# Patient Record
Sex: Male | Born: 1970 | Race: White | Hispanic: No | Marital: Married | Smoking: Never smoker
Health system: Southern US, Community
[De-identification: ages and names within clinical notes are randomized; demographics above are authoritative.]

## PROBLEM LIST (undated history)

## (undated) DIAGNOSIS — E291 Testicular hypofunction: Secondary | ICD-10-CM

## (undated) DIAGNOSIS — E781 Pure hyperglyceridemia: Secondary | ICD-10-CM

## (undated) DIAGNOSIS — M545 Low back pain, unspecified: Secondary | ICD-10-CM

## (undated) DIAGNOSIS — R7989 Other specified abnormal findings of blood chemistry: Secondary | ICD-10-CM

## (undated) DIAGNOSIS — M25519 Pain in unspecified shoulder: Secondary | ICD-10-CM

## (undated) DIAGNOSIS — E7800 Pure hypercholesterolemia, unspecified: Secondary | ICD-10-CM

## (undated) DIAGNOSIS — L309 Dermatitis, unspecified: Secondary | ICD-10-CM

## (undated) DIAGNOSIS — E119 Type 2 diabetes mellitus without complications: Secondary | ICD-10-CM

## (undated) DIAGNOSIS — J302 Other seasonal allergic rhinitis: Secondary | ICD-10-CM

## (undated) DIAGNOSIS — E113293 Type 2 diabetes mellitus with mild nonproliferative diabetic retinopathy without macular edema, bilateral: Secondary | ICD-10-CM

## (undated) DIAGNOSIS — F324 Major depressive disorder, single episode, in partial remission: Secondary | ICD-10-CM

## (undated) DIAGNOSIS — E669 Obesity, unspecified: Secondary | ICD-10-CM

## (undated) DIAGNOSIS — I10 Essential (primary) hypertension: Secondary | ICD-10-CM

## (undated) DIAGNOSIS — K645 Perianal venous thrombosis: Secondary | ICD-10-CM

## (undated) DIAGNOSIS — M1990 Unspecified osteoarthritis, unspecified site: Secondary | ICD-10-CM

## (undated) DIAGNOSIS — N182 Chronic kidney disease, stage 2 (mild): Secondary | ICD-10-CM

## (undated) DIAGNOSIS — R7401 Elevation of levels of liver transaminase levels: Secondary | ICD-10-CM

## (undated) DIAGNOSIS — M549 Dorsalgia, unspecified: Secondary | ICD-10-CM

## (undated) DIAGNOSIS — G8929 Other chronic pain: Secondary | ICD-10-CM

## (undated) DIAGNOSIS — G5761 Lesion of plantar nerve, right lower limb: Secondary | ICD-10-CM

## (undated) DIAGNOSIS — F419 Anxiety disorder, unspecified: Secondary | ICD-10-CM

## (undated) DIAGNOSIS — K219 Gastro-esophageal reflux disease without esophagitis: Secondary | ICD-10-CM

## (undated) HISTORY — DX: Obesity, unspecified: E66.9

## (undated) HISTORY — DX: Dorsalgia, unspecified: M54.9

## (undated) HISTORY — DX: Essential (primary) hypertension: I10

## (undated) HISTORY — DX: Type 2 diabetes mellitus with mild nonproliferative diabetic retinopathy without macular edema, bilateral: E11.3293

## (undated) HISTORY — DX: Other seasonal allergic rhinitis: J30.2

## (undated) HISTORY — PX: SHOULDER SURGERY: SHX246

## (undated) HISTORY — DX: Low back pain, unspecified: M54.50

## (undated) HISTORY — DX: Dermatitis, unspecified: L30.9

## (undated) HISTORY — DX: Lesion of plantar nerve, right lower limb: G57.61

## (undated) HISTORY — PX: TARSAL TUNNEL RELEASE: SHX5042

## (undated) HISTORY — DX: Pure hyperglyceridemia: E78.1

## (undated) HISTORY — DX: Testicular hypofunction: E29.1

## (undated) HISTORY — DX: Other chronic pain: G89.29

## (undated) HISTORY — DX: Chronic kidney disease, stage 2 (mild): N18.2

## (undated) HISTORY — DX: Other specified abnormal findings of blood chemistry: R79.89

## (undated) HISTORY — DX: Unspecified osteoarthritis, unspecified site: M19.90

## (undated) HISTORY — DX: Pure hypercholesterolemia, unspecified: E78.00

## (undated) HISTORY — DX: Pain in unspecified shoulder: M25.519

## (undated) HISTORY — PX: OTHER SURGICAL HISTORY: SHX169

## (undated) HISTORY — DX: Anxiety disorder, unspecified: F41.9

## (undated) HISTORY — DX: Gastro-esophageal reflux disease without esophagitis: K21.9

## (undated) HISTORY — DX: Elevation of levels of liver transaminase levels: R74.01

## (undated) HISTORY — DX: Major depressive disorder, single episode, in partial remission: F32.4

## (undated) HISTORY — DX: Perianal venous thrombosis: K64.5

## (undated) HISTORY — DX: Type 2 diabetes mellitus without complications: E11.9

---
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
## 2000-02-28 ENCOUNTER — Encounter: Payer: Self-pay | Admitting: Specialist

## 2000-02-28 ENCOUNTER — Ambulatory Visit (HOSPITAL_COMMUNITY): Admission: RE | Admit: 2000-02-28 | Discharge: 2000-02-28 | Payer: Self-pay | Admitting: Specialist

## 2000-04-07 ENCOUNTER — Encounter: Payer: Self-pay | Admitting: Family Medicine

## 2000-04-07 ENCOUNTER — Ambulatory Visit (HOSPITAL_COMMUNITY): Admission: EM | Admit: 2000-04-07 | Discharge: 2000-04-07 | Payer: Self-pay | Admitting: *Deleted

## 2003-12-12 ENCOUNTER — Ambulatory Visit (HOSPITAL_COMMUNITY): Admission: RE | Admit: 2003-12-12 | Discharge: 2003-12-12 | Payer: Self-pay | Admitting: Orthopedic Surgery

## 2004-01-17 ENCOUNTER — Ambulatory Visit (HOSPITAL_BASED_OUTPATIENT_CLINIC_OR_DEPARTMENT_OTHER): Admission: RE | Admit: 2004-01-17 | Discharge: 2004-01-17 | Payer: Self-pay | Admitting: Orthopedic Surgery

## 2006-08-07 ENCOUNTER — Encounter: Admission: RE | Admit: 2006-08-07 | Discharge: 2006-08-07 | Payer: Self-pay | Admitting: Specialist

## 2006-08-14 ENCOUNTER — Emergency Department (HOSPITAL_COMMUNITY): Admission: EM | Admit: 2006-08-14 | Discharge: 2006-08-14 | Payer: Self-pay | Admitting: Emergency Medicine

## 2009-04-25 ENCOUNTER — Encounter: Admission: RE | Admit: 2009-04-25 | Discharge: 2009-04-25 | Payer: Self-pay | Admitting: Family Medicine

## 2010-09-13 ENCOUNTER — Encounter: Admission: RE | Admit: 2010-09-13 | Discharge: 2010-09-13 | Payer: Self-pay | Admitting: Family Medicine

## 2011-04-05 NOTE — Op Note (Signed)
NAME:  Jesse Miller, Jesse Miller                        ACCOUNT NO.:  [id number]   MEDICAL RECORD NO.:  [id number]                   PATIENT TYPE:  AMB   LOCATION:  DSC                                  FACILITY:  [facility]   PHYSICIAN:  Leonides Grills, M.D.                  DATE OF BIRTH:  09/15/1971   DATE OF PROCEDURE:  01/17/2004  DATE OF DISCHARGE:                                 OPERATIVE REPORT   PREOPERATIVE DIAGNOSIS:  1. Right ankle impingement.  2. Right tarsal tunnel.  3. Right flexor hallucis longus tenosynovitis.  4. 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   POSTOPERATIVE DIAGNOSIS:  1. Right ankle impingement.  2. Right tarsal tunnel.  3. Right flexor hallucis longus tenosynovitis.  4. 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   OPERATION PERFORMED:  1. Right ankle arthroscopy with extensive debridement.  2. Right tarsal tunnel release.  3. Right excision os trigonum with posterior ankle tenosynovectomy.  4. Tenolysis at right flexor hallucis longus tendon.   SURGEON:  Leonides Grills, M.D.   ASSISTANT:  Lianne Cure, P.A.   ANESTHESIA:  General endotracheal tube.   ESTIMATED BLOOD LOSS:  Minimal.   TOURNIQUET TIME:  Approximately an hour an a half.   COMPLICATIONS:  None.   DISPOSITION:  Stable to PR.   INDICATIONS FOR PROCEDURE:  The patient is a 40 year old male with  persistent posteromedial, posterior and anterior ankle pain that was  interfering with his life to the point that he can not do what he wants to  do.  The patient  has consented for the above procedure.  All risks which  include infection, neurovascular injury, persistent pain, worsening pain,  stiffness, arthritis were all explained, questions were encouraged and  answered.   DESCRIPTION OF PROCEDURE:  The patient was brought to the operating room and  placed in supine position after adequate general endotracheal tube  anesthesia was administered as well as Ancef IV piggyback.   He was also  given a popliteal block preoperatively as well.  We then placed him in a leg  holder.  All bony prominences were well padded and we externally rotated the  leg to allow for adequate posteromedial dissection. We then prepped and  draped the right lower extremity in a sterile manner.  We gravity  exsanguinated the right lower extremity and the  tourniquet was elevated to  290 mmHg.  A curvilinear incision midline over the posteromedial aspect of  the right ankle was then made.  Dissection was carried down through skin and  hemostasis was obtained.  The tarsal tunnel was then identified and this was  then released completely dissecting off the neurovascular structures  carefully throughout this procedure.  Neurovascular structures were then  retracted posteriorly and the FHL tendon was then identified.  This was then  released completely down to the sinus tarsi.  Once this was done, we then  retracted this posteriorly and identified the posterior aspect of the ankle  joint. Capsulotomy was then made and the os trigonum was then removed as  well as the local synovitis in the area.  Once this was extensively debrided  with a rongeur, we then closed subcu with 3-0 Vicryl.  Skin was closed with  4-0 nylon.  We then internally rotated the leg using a bolster.  All bony  prominences were well padded and then marked out the anatomical landmarks on  the anterior aspect of the ankle.  Anteromedial portal was then created with  a nick and spread technique.  Once this was done, the blunt tip trocar  followed by a cannula was then placed followed by the scope.  Under direct  visualization the anterolateral portal was then created with spinal needle  followed by a nick and spread technique.  Once this was done, there was a  large amount of synovitis in the ankle. There was actually a band across the  entire aspect of the ankle and the accessory tib-fib ligament was also  impeding the  anterolateral aspect of the ankle. This was then debrided with  a bevel as well as shaver.  The lateral gutter also had a tremendous amount  of synovitis which was debrided with a shaver as well.  Anteromedially,  there was a large amount of synovitis and this was also debrided as well  with a shaver and bevel.  Pictures were obtained throughout this procedure.  Tourniquet was deflated.  There was a palpable posterior tibial artery.  No  pulsatile bleeding.  The wounds were closed with 4-0 nylon.  Sterile  dressing was applied.  Modified Jones dressing was applied.  The patient was  stable to the PR.                                               Leonides Grills, M.D.    PB/MEDQ  D:  01/17/2004  T:  01/18/2004  Job:  [id number]

## 2011-06-05 ENCOUNTER — Other Ambulatory Visit: Payer: Self-pay | Admitting: Family Medicine

## 2011-06-05 DIAGNOSIS — Z139 Encounter for screening, unspecified: Secondary | ICD-10-CM

## 2011-06-05 DIAGNOSIS — R5383 Other fatigue: Secondary | ICD-10-CM

## 2011-06-05 DIAGNOSIS — R42 Dizziness and giddiness: Secondary | ICD-10-CM

## 2011-06-06 ENCOUNTER — Other Ambulatory Visit: Payer: Self-pay

## 2011-06-07 ENCOUNTER — Other Ambulatory Visit: Payer: Self-pay

## 2013-01-04 ENCOUNTER — Other Ambulatory Visit: Payer: Self-pay | Admitting: Family Medicine

## 2013-01-04 ENCOUNTER — Ambulatory Visit
Admission: RE | Admit: 2013-01-04 | Discharge: 2013-01-04 | Disposition: A | Discharge status: Home / Self Care | Payer: BC Managed Care – PPO | Source: Ambulatory Visit | Attending: Family Medicine | Admitting: Family Medicine

## 2013-01-04 DIAGNOSIS — I83891 Varicose veins of right lower extremities with other complications: Secondary | ICD-10-CM

## 2013-01-04 DIAGNOSIS — M79604 Pain in right leg: Secondary | ICD-10-CM

## 2015-02-27 ENCOUNTER — Other Ambulatory Visit: Payer: Self-pay | Admitting: Family Medicine

## 2015-02-27 DIAGNOSIS — M5416 Radiculopathy, lumbar region: Secondary | ICD-10-CM

## 2015-02-27 DIAGNOSIS — M545 Low back pain: Secondary | ICD-10-CM

## 2015-02-28 ENCOUNTER — Ambulatory Visit
Admission: RE | Admit: 2015-02-28 | Discharge: 2015-02-28 | Disposition: A | Discharge status: Home / Self Care | Payer: PRIVATE HEALTH INSURANCE | Source: Ambulatory Visit | Attending: Family Medicine | Admitting: Family Medicine

## 2015-02-28 DIAGNOSIS — M545 Low back pain: Secondary | ICD-10-CM

## 2015-02-28 DIAGNOSIS — M5416 Radiculopathy, lumbar region: Secondary | ICD-10-CM

## 2015-03-15 ENCOUNTER — Other Ambulatory Visit: Payer: Self-pay | Admitting: Neurological Surgery

## 2015-03-15 DIAGNOSIS — M545 Low back pain, unspecified: Secondary | ICD-10-CM

## 2015-03-15 DIAGNOSIS — G8929 Other chronic pain: Secondary | ICD-10-CM

## 2015-03-16 ENCOUNTER — Ambulatory Visit
Admission: RE | Admit: 2015-03-16 | Discharge: 2015-03-16 | Disposition: A | Discharge status: Home / Self Care | Payer: PRIVATE HEALTH INSURANCE | Source: Ambulatory Visit | Attending: Neurological Surgery | Admitting: Neurological Surgery

## 2015-03-16 DIAGNOSIS — G8929 Other chronic pain: Secondary | ICD-10-CM

## 2015-03-16 DIAGNOSIS — M545 Low back pain: Principal | ICD-10-CM

## 2015-03-16 MED ORDER — METHYLPREDNISOLONE ACETATE 40 MG/ML INJ SUSP (RADIOLOG
120.0000 mg | Freq: Once | INTRAMUSCULAR | Status: AC
  Administered 2015-03-16: 120 mg via EPIDURAL

## 2015-03-16 MED ORDER — IOHEXOL 180 MG/ML  SOLN
1.0000 mL | Freq: Once | INTRAMUSCULAR | Status: AC | PRN
  Administered 2015-03-16: 1 mL via EPIDURAL

## 2015-03-16 NOTE — Discharge Instructions (Signed)

## 2015-03-17 ENCOUNTER — Other Ambulatory Visit: Payer: PRIVATE HEALTH INSURANCE

## 2015-10-27 ENCOUNTER — Other Ambulatory Visit: Payer: Self-pay | Admitting: Family Medicine

## 2015-10-27 DIAGNOSIS — M25512 Pain in left shoulder: Secondary | ICD-10-CM

## 2015-10-29 ENCOUNTER — Ambulatory Visit
Admission: RE | Admit: 2015-10-29 | Discharge: 2015-10-29 | Disposition: A | Discharge status: Home / Self Care | Payer: PRIVATE HEALTH INSURANCE | Source: Ambulatory Visit | Attending: Family Medicine | Admitting: Family Medicine

## 2015-10-29 DIAGNOSIS — M25512 Pain in left shoulder: Secondary | ICD-10-CM

## 2016-09-24 ENCOUNTER — Other Ambulatory Visit: Payer: Self-pay | Admitting: Family Medicine

## 2016-09-24 DIAGNOSIS — M545 Low back pain, unspecified: Secondary | ICD-10-CM

## 2016-10-02 ENCOUNTER — Ambulatory Visit
Admission: RE | Admit: 2016-10-02 | Discharge: 2016-10-02 | Disposition: A | Discharge status: Home / Self Care | Payer: 59 | Source: Ambulatory Visit | Attending: Family Medicine | Admitting: Family Medicine

## 2016-10-02 DIAGNOSIS — M545 Low back pain, unspecified: Secondary | ICD-10-CM

## 2016-10-02 MED ORDER — METHYLPREDNISOLONE ACETATE 40 MG/ML INJ SUSP (RADIOLOG
120.0000 mg | Freq: Once | INTRAMUSCULAR | Status: AC
  Administered 2016-10-02: 120 mg via EPIDURAL

## 2016-10-02 MED ORDER — IOPAMIDOL (ISOVUE-M 200) INJECTION 41%
1.0000 mL | Freq: Once | INTRAMUSCULAR | Status: AC
  Administered 2016-10-02: 1 mL via EPIDURAL

## 2016-10-02 NOTE — Discharge Instructions (Signed)

## 2017-06-10 ENCOUNTER — Ambulatory Visit
Admission: RE | Admit: 2017-06-10 | Discharge: 2017-06-10 | Disposition: A | Discharge status: Home / Self Care | Payer: 59 | Source: Ambulatory Visit | Attending: Family Medicine | Admitting: Family Medicine

## 2017-06-10 ENCOUNTER — Other Ambulatory Visit: Payer: Self-pay | Admitting: Family Medicine

## 2017-06-10 DIAGNOSIS — M545 Low back pain: Principal | ICD-10-CM

## 2017-06-10 DIAGNOSIS — G8929 Other chronic pain: Secondary | ICD-10-CM

## 2017-06-10 MED ORDER — IOPAMIDOL (ISOVUE-M 200) INJECTION 41%
1.0000 mL | Freq: Once | INTRAMUSCULAR | Status: AC
  Administered 2017-06-10: 1 mL via EPIDURAL

## 2017-06-10 MED ORDER — METHYLPREDNISOLONE ACETATE 40 MG/ML INJ SUSP (RADIOLOG
120.0000 mg | Freq: Once | INTRAMUSCULAR | Status: AC
  Administered 2017-06-10: 120 mg via EPIDURAL

## 2017-06-10 NOTE — Discharge Instructions (Signed)

## 2017-07-02 ENCOUNTER — Other Ambulatory Visit: Payer: Self-pay | Admitting: Family Medicine

## 2017-07-02 DIAGNOSIS — M545 Low back pain: Principal | ICD-10-CM

## 2017-07-02 DIAGNOSIS — G8929 Other chronic pain: Secondary | ICD-10-CM

## 2017-07-09 ENCOUNTER — Ambulatory Visit
Admission: RE | Admit: 2017-07-09 | Discharge: 2017-07-09 | Disposition: A | Discharge status: Home / Self Care | Payer: 59 | Source: Ambulatory Visit | Attending: Family Medicine | Admitting: Family Medicine

## 2017-07-09 DIAGNOSIS — G8929 Other chronic pain: Secondary | ICD-10-CM

## 2017-07-09 DIAGNOSIS — M545 Low back pain: Principal | ICD-10-CM

## 2017-07-09 MED ORDER — IOPAMIDOL (ISOVUE-M 200) INJECTION 41%
1.0000 mL | Freq: Once | INTRAMUSCULAR | Status: AC
  Administered 2017-07-09: 1 mL via EPIDURAL

## 2017-07-09 MED ORDER — METHYLPREDNISOLONE ACETATE 40 MG/ML INJ SUSP (RADIOLOG
120.0000 mg | Freq: Once | INTRAMUSCULAR | Status: AC
  Administered 2017-07-09: 120 mg via EPIDURAL

## 2020-02-21 ENCOUNTER — Other Ambulatory Visit: Payer: Self-pay | Admitting: Family Medicine

## 2020-02-21 DIAGNOSIS — G8929 Other chronic pain: Secondary | ICD-10-CM

## 2020-02-25 ENCOUNTER — Ambulatory Visit
Admission: RE | Admit: 2020-02-25 | Discharge: 2020-02-25 | Disposition: A | Discharge status: Home / Self Care | Payer: 59 | Source: Ambulatory Visit | Attending: Family Medicine | Admitting: Family Medicine

## 2020-02-25 ENCOUNTER — Other Ambulatory Visit: Payer: Self-pay

## 2020-02-25 DIAGNOSIS — G8929 Other chronic pain: Secondary | ICD-10-CM

## 2020-02-25 MED ORDER — IOPAMIDOL (ISOVUE-M 200) INJECTION 41%
1.0000 mL | Freq: Once | INTRAMUSCULAR | Status: AC
  Administered 2020-02-25: 1 mL via EPIDURAL

## 2020-02-25 MED ORDER — METHYLPREDNISOLONE ACETATE 40 MG/ML INJ SUSP (RADIOLOG
120.0000 mg | Freq: Once | INTRAMUSCULAR | Status: AC
  Administered 2020-02-25: 120 mg via EPIDURAL

## 2020-02-25 NOTE — Discharge Instructions (Signed)

## 2020-04-12 ENCOUNTER — Ambulatory Visit: Payer: 59 | Admitting: Cardiology

## 2020-04-21 ENCOUNTER — Ambulatory Visit: Payer: 59 | Admitting: Interventional Cardiology

## 2020-04-26 ENCOUNTER — Other Ambulatory Visit: Payer: Self-pay | Admitting: Family Medicine

## 2020-04-26 DIAGNOSIS — G8929 Other chronic pain: Secondary | ICD-10-CM

## 2020-04-26 DIAGNOSIS — M545 Low back pain, unspecified: Secondary | ICD-10-CM

## 2020-05-01 ENCOUNTER — Ambulatory Visit
Admission: RE | Admit: 2020-05-01 | Discharge: 2020-05-01 | Disposition: A | Discharge status: Home / Self Care | Payer: 59 | Source: Ambulatory Visit | Attending: Family Medicine | Admitting: Family Medicine

## 2020-05-01 ENCOUNTER — Other Ambulatory Visit: Payer: Self-pay

## 2020-05-01 DIAGNOSIS — G8929 Other chronic pain: Secondary | ICD-10-CM

## 2020-05-01 DIAGNOSIS — M545 Low back pain, unspecified: Secondary | ICD-10-CM

## 2020-05-01 MED ORDER — IOPAMIDOL (ISOVUE-M 200) INJECTION 41%
1.0000 mL | Freq: Once | INTRAMUSCULAR | Status: AC
  Administered 2020-05-01: 1 mL via EPIDURAL

## 2020-05-01 MED ORDER — METHYLPREDNISOLONE ACETATE 40 MG/ML INJ SUSP (RADIOLOG
120.0000 mg | Freq: Once | INTRAMUSCULAR | Status: AC
  Administered 2020-05-01: 120 mg via EPIDURAL

## 2020-05-01 NOTE — Discharge Instructions (Signed)

## 2020-06-13 ENCOUNTER — Ambulatory Visit: Payer: 59 | Admitting: Interventional Cardiology

## 2020-06-15 ENCOUNTER — Other Ambulatory Visit: Payer: Self-pay

## 2020-06-15 ENCOUNTER — Other Ambulatory Visit: Payer: Self-pay | Admitting: Physician Assistant

## 2020-06-15 ENCOUNTER — Ambulatory Visit
Admission: RE | Admit: 2020-06-15 | Discharge: 2020-06-15 | Disposition: A | Discharge status: Home / Self Care | Payer: 59 | Source: Ambulatory Visit | Attending: Physician Assistant | Admitting: Physician Assistant

## 2020-06-15 DIAGNOSIS — R059 Cough, unspecified: Secondary | ICD-10-CM

## 2020-08-24 ENCOUNTER — Ambulatory Visit: Payer: 59 | Admitting: Interventional Cardiology

## 2020-08-24 ENCOUNTER — Encounter: Payer: Self-pay | Admitting: General Practice

## 2020-08-28 DIAGNOSIS — U071 COVID-19: Secondary | ICD-10-CM | POA: Diagnosis not present

## 2020-08-29 ENCOUNTER — Telehealth: Payer: Self-pay | Admitting: Nurse Practitioner

## 2020-08-29 ENCOUNTER — Other Ambulatory Visit (HOSPITAL_COMMUNITY): Payer: Self-pay

## 2020-08-29 ENCOUNTER — Telehealth: Payer: Self-pay | Admitting: Unknown Physician Specialty

## 2020-08-29 NOTE — Telephone Encounter (Signed)
Called to discuss with Dian Queen about Covid symptoms and the use of the monoclonal antibody infusion for those with mild to moderate Covid symptoms and at a high risk of hospitalization.     Pt is qualified for this infusion at the [facility] infusion center due to co-morbid conditions (as indicated below) and/or a member of an at-risk group in accordance with the FDA Emergency Use Authorization.   Unable to reach. Voicemail full.   Willette Alma, AGPCNP-BC

## 2020-08-29 NOTE — Telephone Encounter (Signed)
Called to Discuss with patient about Covid symptoms and the use of the monoclonal antibody infusion for those with mild to moderate Covid symptoms and at a high risk of hospitalization.     Pt appears to qualify for this infusion due to co-morbid conditions and/or a member of an at-risk group in accordance with the FDA Emergency Use Authorization.    Unable to reach pt    

## 2020-08-30 ENCOUNTER — Other Ambulatory Visit: Payer: Self-pay | Admitting: Family

## 2020-08-30 ENCOUNTER — Telehealth: Payer: Self-pay | Admitting: Family

## 2020-08-30 DIAGNOSIS — E119 Type 2 diabetes mellitus without complications: Secondary | ICD-10-CM

## 2020-08-30 DIAGNOSIS — I10 Essential (primary) hypertension: Secondary | ICD-10-CM

## 2020-08-30 DIAGNOSIS — U071 COVID-19: Secondary | ICD-10-CM

## 2020-08-30 DIAGNOSIS — E669 Obesity, unspecified: Secondary | ICD-10-CM

## 2020-08-30 NOTE — Telephone Encounter (Signed)
Called to Discuss with patient about Covid symptoms and the use of the monoclonal antibody infusion for those with mild to moderate Covid symptoms and at a high risk of hospitalization.     Pt appears to qualify for this infusion due to co-morbid conditions and/or a member of an at-risk group in accordance with the FDA Emergency Use Authorization.    Mr. Moten onset of symptoms was 10/9 and is currently experiencing congestion, back pain, headache, and fatigue. Positive COVID test at [facility] in [place] with positive results sent to his PCP Dr. Tiburcio Pea. Results were sent to infusion clinic. Qualifying risk factors include diabetes, BMI >25, and hypertension.   Discussed the risks, benefits, and potential financial charges related to treatment with monoclonal antibodies and he wishes to proceed with treatment.   Hello IRBY FAILS,   You have been scheduled to receive the monoclonal antibody therapy at [facility]: 08/31/20 at 12:30pm   If you have been tested outside of a [facility] - you MUST bring a copy of your positive test with you the morning of your appointment. You may take a photo of this and upload to your MyChart portal or have the testing facility fax the result to [phone]    The address for the infusion clinic site is:  --GPS address is [address] - the parking is located near [employer] building where you will see  COVID19 Infusion feather banner marking the entrance to parking.   (see photos below)            --Enter into the 2nd entrance where the "wave, flag banner" is at the road. Turn into this 2nd entrance and immediately turn left to park in 1 of the 5 parking spots.   --Please stay in your car and call the desk for assistance inside [phone].   --Average time in department is roughly 2 hours for Regeneron treatment - this includes preparation of the medication, IV start and the required 1 hour monitoring after the  infusion.    Should you develop worsening shortness of breath, chest pain or severe breathing problems please do not wait for this appointment and go to the Emergency room for evaluation and treatment. You will undergo another oxygen screen before your infusion to ensure this is the best treatment option for you. There is a chance that the best decision may be to send you to the Emergency Room for evaluation at the time of your appointment.   The day of your visit you should: Marland Kitchen Get plenty of rest the night before and drink plenty of water . Eat a light meal/snack before coming and take your medications as prescribed  . Wear warm, comfortable clothes with a shirt that can roll-up over the elbow (will need IV start).  . Wear a mask  . Consider bringing some activity to help pass the time  Many commercial insurers are waiving bills related to COVID treatment however some have ranged from $300-640. We are starting to see some insurers send bills to patients later for the administration of the medication - we are learning more information but you may receive a bill after your appointment.  Please contact your insurance agent to discuss prior to your appointment if you would like further details about billing specific to your policy.    The CPT code is [phone] for your reference.    Marcos Eke, NP 08/30/2020 3:35 PM

## 2020-08-30 NOTE — Telephone Encounter (Signed)
Returned call from Mr. Veras from this morning and received his voicemail. Generic message with hotline number was left. Of note this is the 3rd attempt to speak with him.   Marcos Eke, NP 08/30/2020 1:42 PM

## 2020-08-30 NOTE — Progress Notes (Signed)
I connected by phone with Dian Queen on 08/30/2020 at 3:36 PM to discuss the potential use of a new treatment for mild to moderate COVID-19 viral infection in non-hospitalized patients.  This patient is a 49 y.o. male that meets the FDA criteria for Emergency Use Authorization of COVID monoclonal antibody casirivimab/imdevimab or bamlanivimab/eteseviamb.  Has a (+) direct SARS-CoV-2 viral test result  Has mild or moderate COVID-19   Is NOT hospitalized due to COVID-19  Is within 10 days of symptom onset  Has at least one of the high risk factor(s) for progression to severe COVID-19 and/or hospitalization as defined in EUA.  Specific high risk criteria : BMI > 25, Diabetes and Cardiovascular disease or hypertension   I have spoken and communicated the following to the patient or parent/caregiver regarding COVID monoclonal antibody treatment:  1. FDA has authorized the emergency use for the treatment of mild to moderate COVID-19 in adults and pediatric patients with positive results of direct SARS-CoV-2 viral testing who are 22 years of age and older weighing at least 40 kg, and who are at high risk for progressing to severe COVID-19 and/or hospitalization.  2. The significant known and potential risks and benefits of COVID monoclonal antibody, and the extent to which such potential risks and benefits are unknown.  3. Information on available alternative treatments and the risks and benefits of those alternatives, including clinical trials.  4. Patients treated with COVID monoclonal antibody should continue to self-isolate and use infection control measures (e.g., wear mask, isolate, social distance, avoid sharing personal items, clean and disinfect "high touch" surfaces, and frequent handwashing) according to CDC guidelines.   5. The patient or parent/caregiver has the option to accept or refuse COVID monoclonal antibody treatment.  After reviewing this information with the patient,  the patient has agreed to receive one of the available covid 19 monoclonal antibodies and will be provided an appropriate fact sheet prior to infusion.   Jeanine Luz, FNP 08/30/2020 3:36 PM

## 2020-08-31 ENCOUNTER — Ambulatory Visit (HOSPITAL_COMMUNITY)
Admission: RE | Admit: 2020-08-31 | Discharge: 2020-08-31 | Disposition: A | Discharge status: Home / Self Care | Payer: BC Managed Care – PPO | Source: Ambulatory Visit | Attending: Pulmonary Disease | Admitting: Pulmonary Disease

## 2020-08-31 DIAGNOSIS — U071 COVID-19: Secondary | ICD-10-CM | POA: Insufficient documentation

## 2020-08-31 DIAGNOSIS — I10 Essential (primary) hypertension: Secondary | ICD-10-CM | POA: Diagnosis not present

## 2020-08-31 DIAGNOSIS — E669 Obesity, unspecified: Secondary | ICD-10-CM | POA: Insufficient documentation

## 2020-08-31 DIAGNOSIS — E119 Type 2 diabetes mellitus without complications: Secondary | ICD-10-CM | POA: Diagnosis not present

## 2020-08-31 MED ORDER — FAMOTIDINE IN NACL 20-0.9 MG/50ML-% IV SOLN: 20.0000 mg | Freq: Once | INTRAVENOUS | Status: DC | PRN

## 2020-08-31 MED ORDER — SODIUM CHLORIDE 0.9 % IV SOLN: INTRAVENOUS | Status: DC | PRN

## 2020-08-31 MED ORDER — SODIUM CHLORIDE 0.9 % IV SOLN: Freq: Once | INTRAVENOUS | Status: AC

## 2020-08-31 MED ORDER — EPINEPHRINE 0.3 MG/0.3ML IJ SOAJ: 0.3000 mg | Freq: Once | INTRAMUSCULAR | Status: DC | PRN

## 2020-08-31 MED ORDER — METHYLPREDNISOLONE SODIUM SUCC 125 MG IJ SOLR: 125.0000 mg | Freq: Once | INTRAMUSCULAR | Status: DC | PRN

## 2020-08-31 MED ORDER — DIPHENHYDRAMINE HCL 50 MG/ML IJ SOLN: 50.0000 mg | Freq: Once | INTRAMUSCULAR | Status: DC | PRN

## 2020-08-31 MED ORDER — ALBUTEROL SULFATE HFA 108 (90 BASE) MCG/ACT IN AERS: 2.0000 | INHALATION_SPRAY | Freq: Once | RESPIRATORY_TRACT | Status: DC | PRN

## 2020-08-31 NOTE — Discharge Instructions (Signed)

## 2020-08-31 NOTE — Progress Notes (Signed)
  Diagnosis: COVID-19  Physician: Dr Delford Field  Procedure: Covid Infusion Clinic Med: bamlanivimab\etesevimab infusion - Provided patient with bamlanimivab\etesevimab fact sheet for patients, parents and caregivers prior to infusion.  Complications: No immediate complications noted.  Discharge: Discharged home   Dimitri Ped 08/31/2020

## 2020-09-19 DIAGNOSIS — Z794 Long term (current) use of insulin: Secondary | ICD-10-CM | POA: Diagnosis not present

## 2020-09-19 DIAGNOSIS — E1169 Type 2 diabetes mellitus with other specified complication: Secondary | ICD-10-CM | POA: Diagnosis not present

## 2020-09-19 DIAGNOSIS — E1165 Type 2 diabetes mellitus with hyperglycemia: Secondary | ICD-10-CM | POA: Diagnosis not present

## 2020-09-19 DIAGNOSIS — E11319 Type 2 diabetes mellitus with unspecified diabetic retinopathy without macular edema: Secondary | ICD-10-CM | POA: Diagnosis not present

## 2020-09-27 DIAGNOSIS — E1169 Type 2 diabetes mellitus with other specified complication: Secondary | ICD-10-CM | POA: Diagnosis not present

## 2020-09-27 DIAGNOSIS — E785 Hyperlipidemia, unspecified: Secondary | ICD-10-CM | POA: Diagnosis not present

## 2020-09-27 DIAGNOSIS — Z794 Long term (current) use of insulin: Secondary | ICD-10-CM | POA: Diagnosis not present

## 2020-09-27 DIAGNOSIS — I10 Essential (primary) hypertension: Secondary | ICD-10-CM | POA: Diagnosis not present

## 2020-09-27 DIAGNOSIS — E1165 Type 2 diabetes mellitus with hyperglycemia: Secondary | ICD-10-CM | POA: Diagnosis not present

## 2020-11-01 DIAGNOSIS — F324 Major depressive disorder, single episode, in partial remission: Secondary | ICD-10-CM | POA: Diagnosis not present

## 2020-11-01 DIAGNOSIS — E7800 Pure hypercholesterolemia, unspecified: Secondary | ICD-10-CM | POA: Diagnosis not present

## 2020-11-01 DIAGNOSIS — E119 Type 2 diabetes mellitus without complications: Secondary | ICD-10-CM | POA: Diagnosis not present

## 2020-11-01 DIAGNOSIS — Z23 Encounter for immunization: Secondary | ICD-10-CM | POA: Diagnosis not present

## 2020-11-01 DIAGNOSIS — I10 Essential (primary) hypertension: Secondary | ICD-10-CM | POA: Diagnosis not present

## 2020-11-28 DIAGNOSIS — M65342 Trigger finger, left ring finger: Secondary | ICD-10-CM | POA: Diagnosis not present

## 2020-11-28 DIAGNOSIS — M65352 Trigger finger, left little finger: Secondary | ICD-10-CM | POA: Diagnosis not present

## 2020-11-28 DIAGNOSIS — M65351 Trigger finger, right little finger: Secondary | ICD-10-CM | POA: Diagnosis not present

## 2020-11-28 DIAGNOSIS — M65341 Trigger finger, right ring finger: Secondary | ICD-10-CM | POA: Diagnosis not present

## 2021-01-25 DIAGNOSIS — E291 Testicular hypofunction: Secondary | ICD-10-CM | POA: Diagnosis not present

## 2021-02-01 DIAGNOSIS — E291 Testicular hypofunction: Secondary | ICD-10-CM | POA: Diagnosis not present

## 2021-03-20 DIAGNOSIS — Z794 Long term (current) use of insulin: Secondary | ICD-10-CM | POA: Diagnosis not present

## 2021-03-20 DIAGNOSIS — E11319 Type 2 diabetes mellitus with unspecified diabetic retinopathy without macular edema: Secondary | ICD-10-CM | POA: Diagnosis not present

## 2021-03-20 DIAGNOSIS — E1169 Type 2 diabetes mellitus with other specified complication: Secondary | ICD-10-CM | POA: Diagnosis not present

## 2021-03-20 DIAGNOSIS — E1165 Type 2 diabetes mellitus with hyperglycemia: Secondary | ICD-10-CM | POA: Diagnosis not present

## 2021-05-17 DIAGNOSIS — E1169 Type 2 diabetes mellitus with other specified complication: Secondary | ICD-10-CM | POA: Diagnosis not present

## 2021-05-17 DIAGNOSIS — E119 Type 2 diabetes mellitus without complications: Secondary | ICD-10-CM | POA: Diagnosis not present

## 2021-05-17 DIAGNOSIS — E669 Obesity, unspecified: Secondary | ICD-10-CM | POA: Diagnosis not present

## 2021-05-17 DIAGNOSIS — I10 Essential (primary) hypertension: Secondary | ICD-10-CM | POA: Diagnosis not present

## 2021-05-17 DIAGNOSIS — E785 Hyperlipidemia, unspecified: Secondary | ICD-10-CM | POA: Diagnosis not present

## 2021-06-12 DIAGNOSIS — E119 Type 2 diabetes mellitus without complications: Secondary | ICD-10-CM | POA: Diagnosis not present

## 2021-06-21 DIAGNOSIS — I10 Essential (primary) hypertension: Secondary | ICD-10-CM | POA: Diagnosis not present

## 2021-06-21 DIAGNOSIS — E119 Type 2 diabetes mellitus without complications: Secondary | ICD-10-CM | POA: Diagnosis not present

## 2021-06-21 DIAGNOSIS — E7800 Pure hypercholesterolemia, unspecified: Secondary | ICD-10-CM | POA: Diagnosis not present

## 2021-07-18 DIAGNOSIS — E119 Type 2 diabetes mellitus without complications: Secondary | ICD-10-CM | POA: Diagnosis not present

## 2021-07-18 DIAGNOSIS — I10 Essential (primary) hypertension: Secondary | ICD-10-CM | POA: Diagnosis not present

## 2021-07-18 DIAGNOSIS — R42 Dizziness and giddiness: Secondary | ICD-10-CM | POA: Diagnosis not present

## 2021-07-18 DIAGNOSIS — E7800 Pure hypercholesterolemia, unspecified: Secondary | ICD-10-CM | POA: Diagnosis not present

## 2021-07-26 DIAGNOSIS — E291 Testicular hypofunction: Secondary | ICD-10-CM | POA: Diagnosis not present

## 2021-07-26 DIAGNOSIS — Z125 Encounter for screening for malignant neoplasm of prostate: Secondary | ICD-10-CM | POA: Diagnosis not present

## 2021-08-02 DIAGNOSIS — E291 Testicular hypofunction: Secondary | ICD-10-CM | POA: Diagnosis not present

## 2021-08-02 DIAGNOSIS — Z125 Encounter for screening for malignant neoplasm of prostate: Secondary | ICD-10-CM | POA: Diagnosis not present

## 2021-09-04 DIAGNOSIS — R079 Chest pain, unspecified: Secondary | ICD-10-CM | POA: Diagnosis not present

## 2021-09-05 DIAGNOSIS — C44619 Basal cell carcinoma of skin of left upper limb, including shoulder: Secondary | ICD-10-CM | POA: Diagnosis not present

## 2021-09-05 DIAGNOSIS — D2271 Melanocytic nevi of right lower limb, including hip: Secondary | ICD-10-CM | POA: Diagnosis not present

## 2021-09-05 DIAGNOSIS — L570 Actinic keratosis: Secondary | ICD-10-CM | POA: Diagnosis not present

## 2021-09-05 DIAGNOSIS — D225 Melanocytic nevi of trunk: Secondary | ICD-10-CM | POA: Diagnosis not present

## 2021-09-12 ENCOUNTER — Other Ambulatory Visit: Payer: Self-pay | Admitting: Family Medicine

## 2021-09-12 DIAGNOSIS — M545 Low back pain, unspecified: Secondary | ICD-10-CM

## 2021-09-12 DIAGNOSIS — G8929 Other chronic pain: Secondary | ICD-10-CM

## 2021-09-12 DIAGNOSIS — M5416 Radiculopathy, lumbar region: Secondary | ICD-10-CM

## 2021-10-17 DIAGNOSIS — J019 Acute sinusitis, unspecified: Secondary | ICD-10-CM | POA: Diagnosis not present

## 2021-10-17 DIAGNOSIS — H66001 Acute suppurative otitis media without spontaneous rupture of ear drum, right ear: Secondary | ICD-10-CM | POA: Diagnosis not present

## 2021-10-18 ENCOUNTER — Other Ambulatory Visit: Payer: Self-pay

## 2021-10-18 ENCOUNTER — Ambulatory Visit
Admission: RE | Admit: 2021-10-18 | Discharge: 2021-10-18 | Disposition: A | Discharge status: Home / Self Care | Payer: 59 | Source: Ambulatory Visit | Attending: Family Medicine | Admitting: Family Medicine

## 2021-10-18 DIAGNOSIS — M545 Low back pain, unspecified: Secondary | ICD-10-CM

## 2021-10-18 DIAGNOSIS — G8929 Other chronic pain: Secondary | ICD-10-CM

## 2021-10-18 DIAGNOSIS — M5416 Radiculopathy, lumbar region: Secondary | ICD-10-CM

## 2021-10-18 DIAGNOSIS — M5127 Other intervertebral disc displacement, lumbosacral region: Secondary | ICD-10-CM | POA: Diagnosis not present

## 2021-10-18 IMAGING — CR DG CHEST 2V
2 series · 2 of 2 positions shown · non-contrast
Comparison: Chest radiograph dated 04/25/2009

CLINICAL DATA: Cough

EXAM:
CHEST - 2 VIEW

[w chest pa]
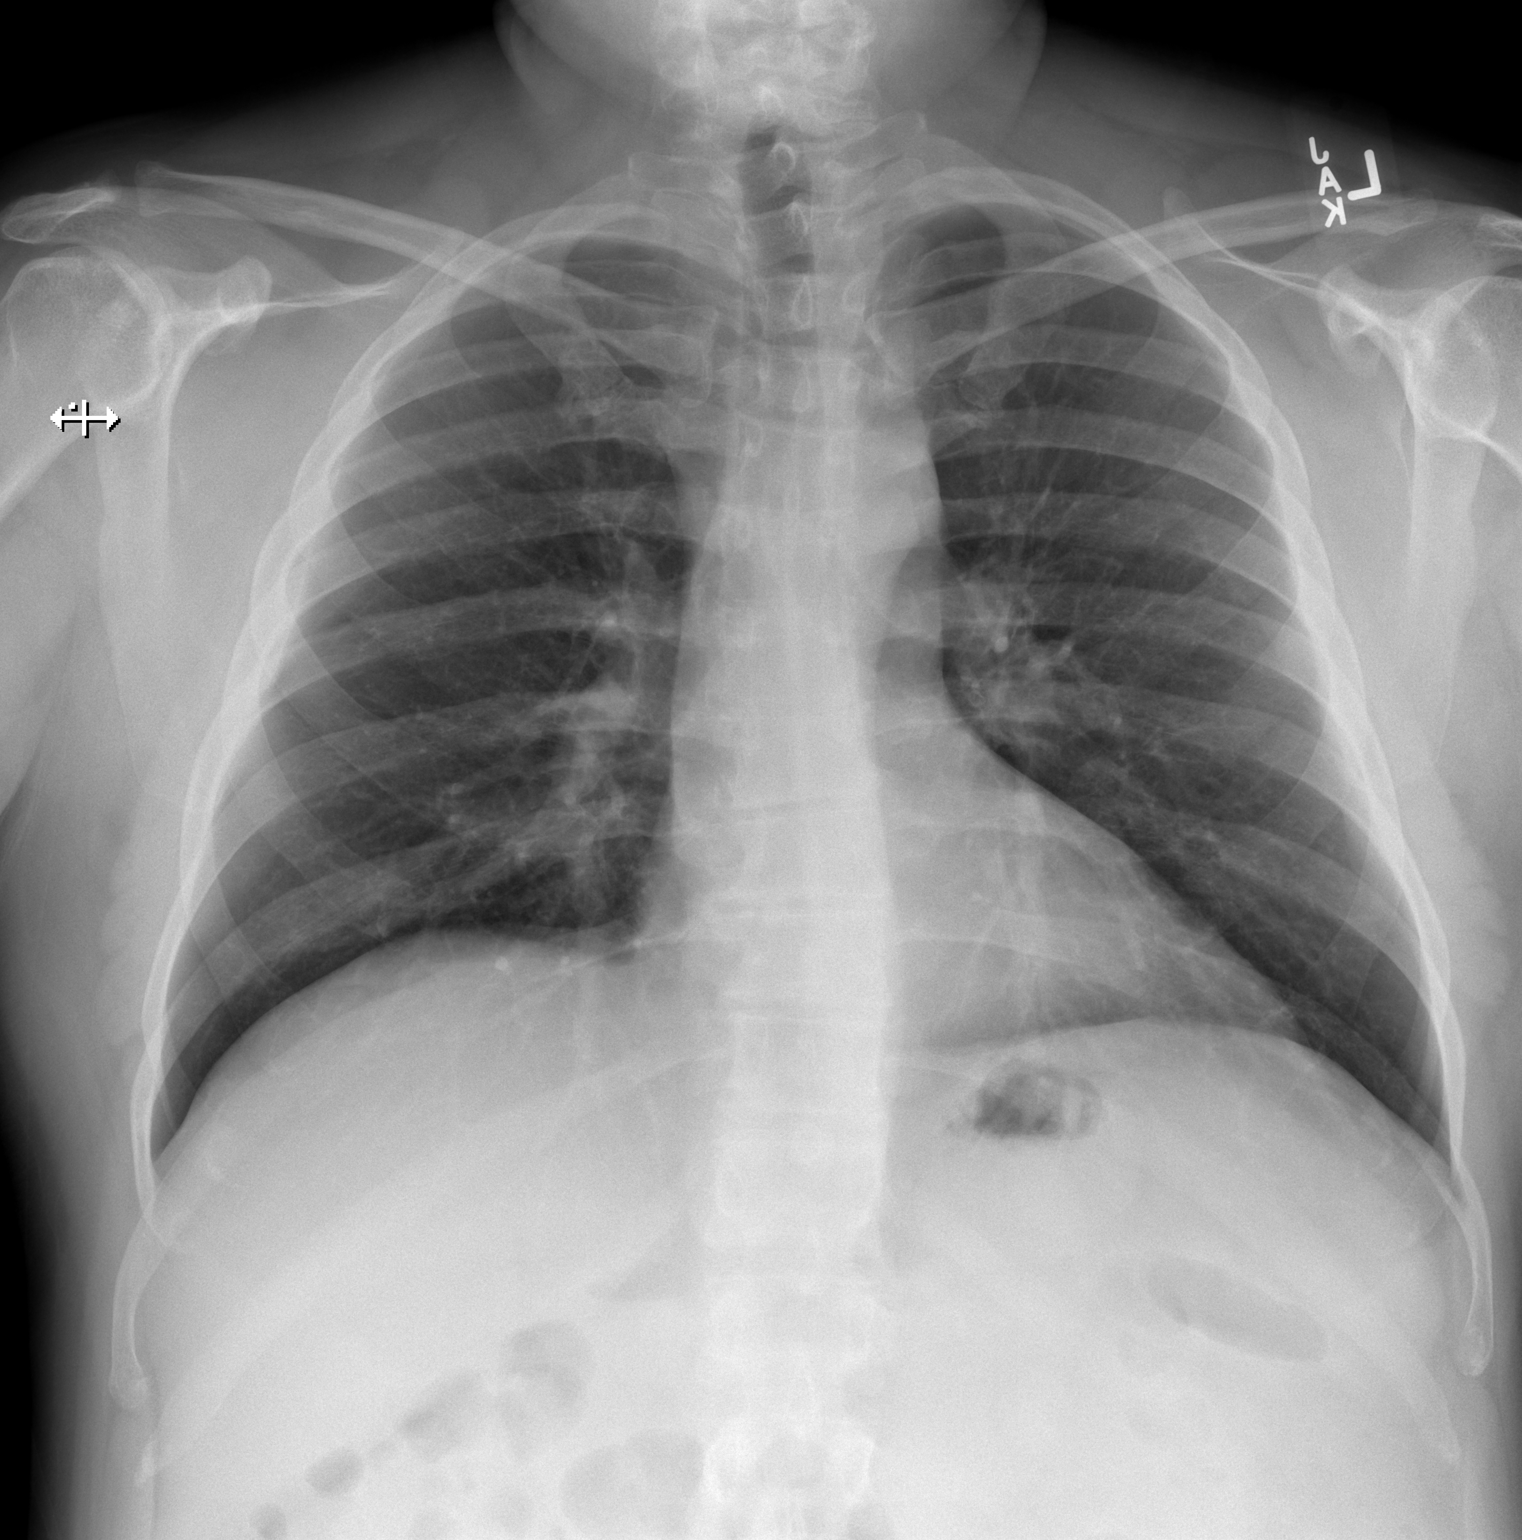

[w chest lat]
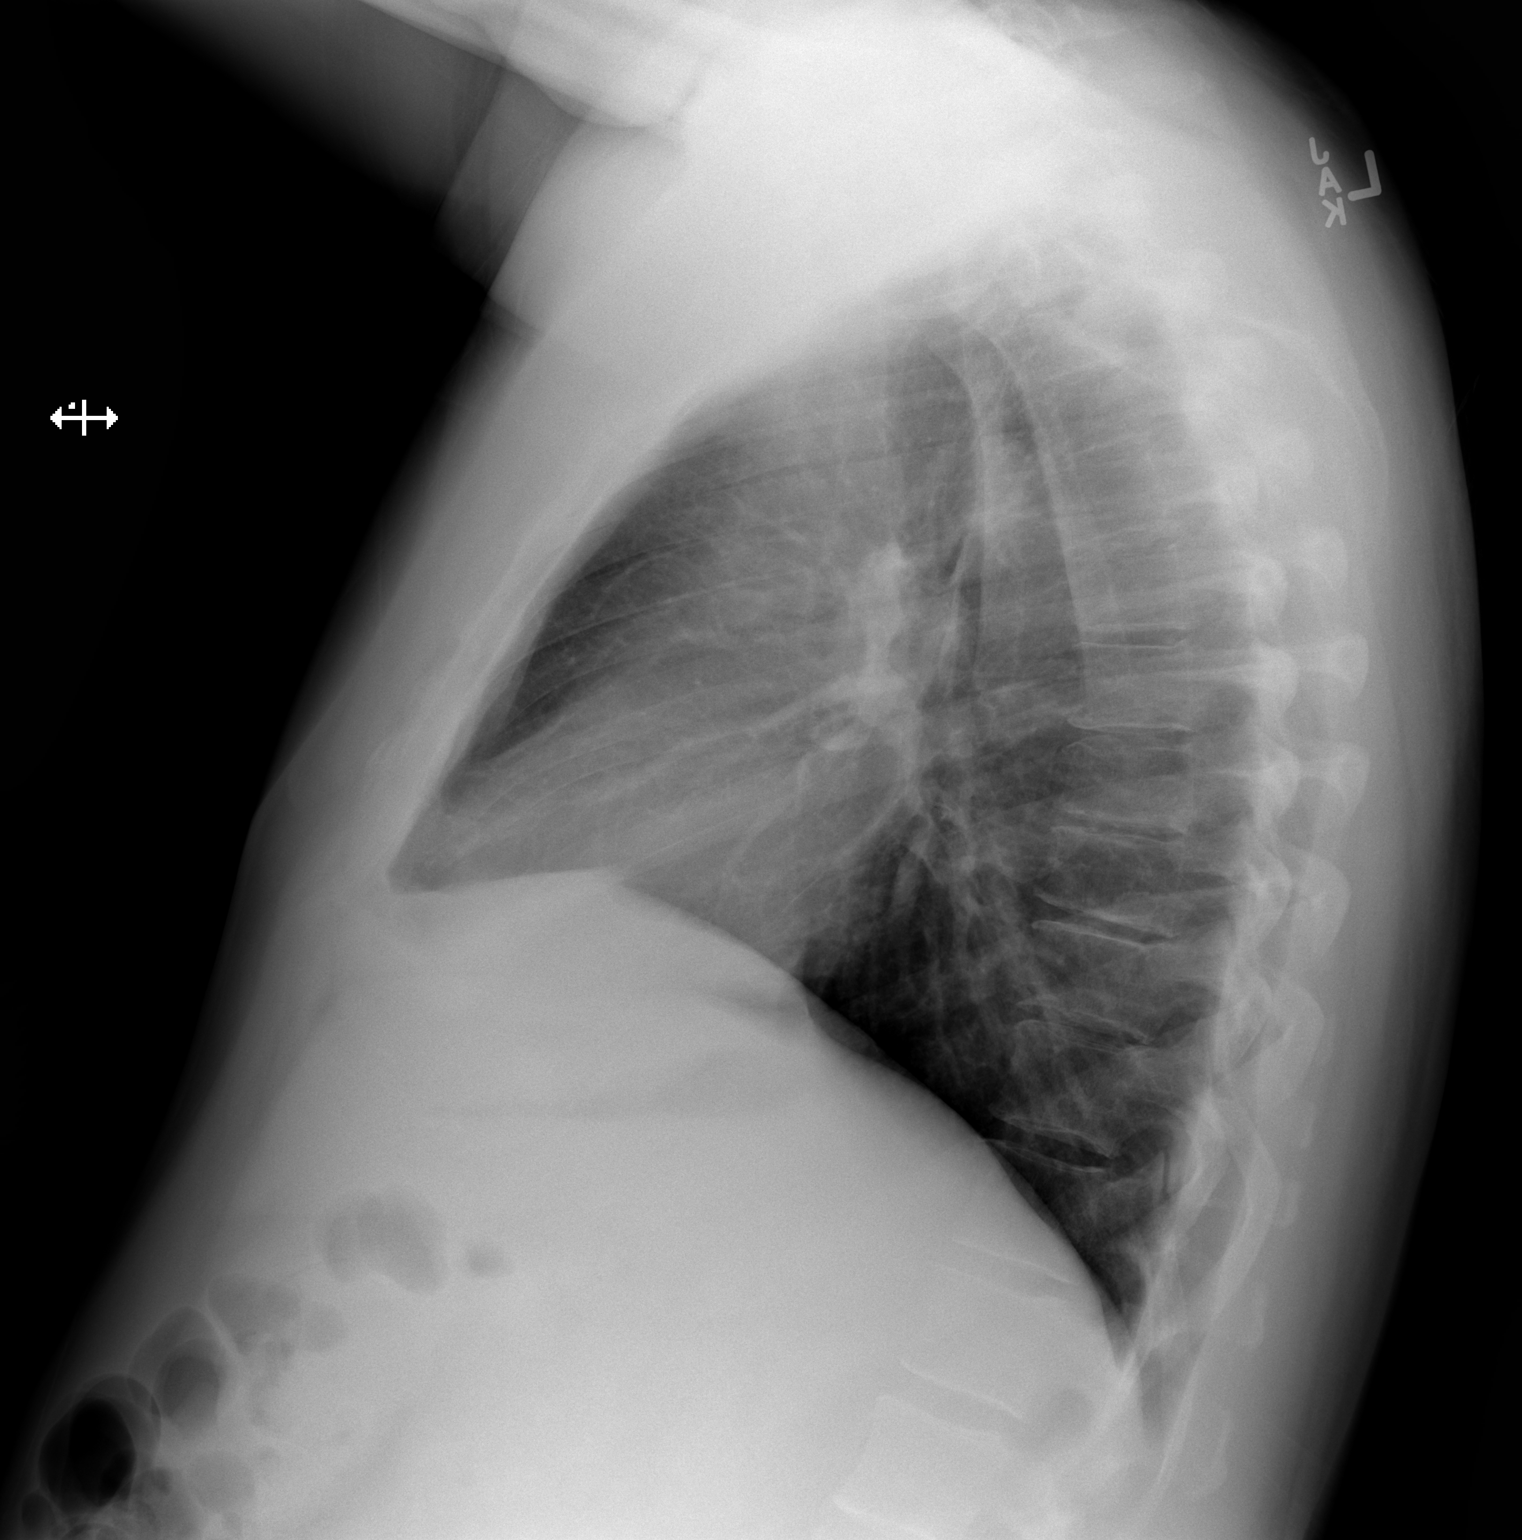

[2 of 2 positions shown; findings below may reference images not displayed]

FINDINGS: The heart size and mediastinal contours are within normal limits.
Both lungs are clear. The visualized skeletal structures are
unremarkable.
IMPRESSION: No active cardiopulmonary disease.

## 2021-10-23 ENCOUNTER — Other Ambulatory Visit: Payer: Self-pay | Admitting: Family Medicine

## 2021-11-15 ENCOUNTER — Other Ambulatory Visit: Payer: Self-pay | Admitting: Family Medicine

## 2021-11-15 DIAGNOSIS — M5440 Lumbago with sciatica, unspecified side: Secondary | ICD-10-CM

## 2021-11-15 DIAGNOSIS — G8929 Other chronic pain: Secondary | ICD-10-CM

## 2021-11-23 ENCOUNTER — Ambulatory Visit
Admission: RE | Admit: 2021-11-23 | Discharge: 2021-11-23 | Disposition: A | Discharge status: Home / Self Care | Payer: BC Managed Care – PPO | Source: Ambulatory Visit | Attending: Family Medicine | Admitting: Family Medicine

## 2021-11-23 DIAGNOSIS — M47817 Spondylosis without myelopathy or radiculopathy, lumbosacral region: Secondary | ICD-10-CM | POA: Diagnosis not present

## 2021-11-23 DIAGNOSIS — M5440 Lumbago with sciatica, unspecified side: Secondary | ICD-10-CM

## 2021-11-23 DIAGNOSIS — G8929 Other chronic pain: Secondary | ICD-10-CM

## 2021-11-23 MED ORDER — IOPAMIDOL (ISOVUE-M 200) INJECTION 41%
1.0000 mL | Freq: Once | INTRAMUSCULAR | Status: AC
  Administered 2021-11-23: 1 mL via EPIDURAL

## 2021-11-23 MED ORDER — METHYLPREDNISOLONE ACETATE 40 MG/ML INJ SUSP (RADIOLOG
80.0000 mg | Freq: Once | INTRAMUSCULAR | Status: AC
  Administered 2021-11-23: 80 mg via EPIDURAL

## 2021-11-23 NOTE — Discharge Instructions (Signed)

## 2021-11-30 DIAGNOSIS — Z20822 Contact with and (suspected) exposure to covid-19: Secondary | ICD-10-CM | POA: Diagnosis not present

## 2021-11-30 DIAGNOSIS — R0982 Postnasal drip: Secondary | ICD-10-CM | POA: Diagnosis not present

## 2021-11-30 DIAGNOSIS — R0981 Nasal congestion: Secondary | ICD-10-CM | POA: Diagnosis not present

## 2021-12-28 DIAGNOSIS — H6591 Unspecified nonsuppurative otitis media, right ear: Secondary | ICD-10-CM | POA: Diagnosis not present

## 2022-02-19 DIAGNOSIS — E291 Testicular hypofunction: Secondary | ICD-10-CM | POA: Diagnosis not present

## 2022-02-21 ENCOUNTER — Other Ambulatory Visit: Payer: Self-pay | Admitting: Family Medicine

## 2022-02-21 DIAGNOSIS — G8929 Other chronic pain: Secondary | ICD-10-CM

## 2022-02-25 ENCOUNTER — Ambulatory Visit
Admission: RE | Admit: 2022-02-25 | Discharge: 2022-02-25 | Disposition: A | Discharge status: Home / Self Care | Payer: BC Managed Care – PPO | Source: Ambulatory Visit | Attending: Family Medicine | Admitting: Family Medicine

## 2022-02-25 DIAGNOSIS — M47817 Spondylosis without myelopathy or radiculopathy, lumbosacral region: Secondary | ICD-10-CM | POA: Diagnosis not present

## 2022-02-25 DIAGNOSIS — E291 Testicular hypofunction: Secondary | ICD-10-CM | POA: Diagnosis not present

## 2022-02-25 DIAGNOSIS — G8929 Other chronic pain: Secondary | ICD-10-CM

## 2022-02-25 MED ORDER — IOPAMIDOL (ISOVUE-M 200) INJECTION 41%
1.0000 mL | Freq: Once | INTRAMUSCULAR | Status: AC
  Administered 2022-02-25: 1 mL via EPIDURAL

## 2022-02-25 MED ORDER — METHYLPREDNISOLONE ACETATE 40 MG/ML INJ SUSP (RADIOLOG
80.0000 mg | Freq: Once | INTRAMUSCULAR | Status: AC
  Administered 2022-02-25: 80 mg via EPIDURAL

## 2022-02-25 NOTE — Discharge Instructions (Signed)

## 2022-03-15 DIAGNOSIS — F324 Major depressive disorder, single episode, in partial remission: Secondary | ICD-10-CM | POA: Diagnosis not present

## 2022-03-15 DIAGNOSIS — E11319 Type 2 diabetes mellitus with unspecified diabetic retinopathy without macular edema: Secondary | ICD-10-CM | POA: Diagnosis not present

## 2022-03-15 DIAGNOSIS — E7800 Pure hypercholesterolemia, unspecified: Secondary | ICD-10-CM | POA: Diagnosis not present

## 2022-03-15 DIAGNOSIS — I10 Essential (primary) hypertension: Secondary | ICD-10-CM | POA: Diagnosis not present

## 2022-03-27 DIAGNOSIS — Z9889 Other specified postprocedural states: Secondary | ICD-10-CM | POA: Diagnosis not present

## 2022-06-19 DIAGNOSIS — Z885 Allergy status to narcotic agent status: Secondary | ICD-10-CM | POA: Diagnosis not present

## 2022-06-19 DIAGNOSIS — Z888 Allergy status to other drugs, medicaments and biological substances status: Secondary | ICD-10-CM | POA: Diagnosis not present

## 2022-06-19 DIAGNOSIS — Z9641 Presence of insulin pump (external) (internal): Secondary | ICD-10-CM | POA: Diagnosis not present

## 2022-06-19 DIAGNOSIS — Z794 Long term (current) use of insulin: Secondary | ICD-10-CM | POA: Diagnosis not present

## 2022-06-19 DIAGNOSIS — E1165 Type 2 diabetes mellitus with hyperglycemia: Secondary | ICD-10-CM | POA: Diagnosis not present

## 2022-06-19 DIAGNOSIS — Z7985 Long-term (current) use of injectable non-insulin antidiabetic drugs: Secondary | ICD-10-CM | POA: Diagnosis not present

## 2022-06-19 DIAGNOSIS — E119 Type 2 diabetes mellitus without complications: Secondary | ICD-10-CM | POA: Diagnosis not present

## 2022-06-26 DIAGNOSIS — E1165 Type 2 diabetes mellitus with hyperglycemia: Secondary | ICD-10-CM | POA: Diagnosis not present

## 2022-06-26 DIAGNOSIS — Z794 Long term (current) use of insulin: Secondary | ICD-10-CM | POA: Diagnosis not present

## 2022-06-29 DIAGNOSIS — E1165 Type 2 diabetes mellitus with hyperglycemia: Secondary | ICD-10-CM | POA: Diagnosis not present

## 2022-06-29 DIAGNOSIS — Z794 Long term (current) use of insulin: Secondary | ICD-10-CM | POA: Diagnosis not present

## 2022-07-17 DIAGNOSIS — E1165 Type 2 diabetes mellitus with hyperglycemia: Secondary | ICD-10-CM | POA: Diagnosis not present

## 2022-07-17 DIAGNOSIS — Z794 Long term (current) use of insulin: Secondary | ICD-10-CM | POA: Diagnosis not present

## 2022-08-10 DIAGNOSIS — R42 Dizziness and giddiness: Secondary | ICD-10-CM | POA: Diagnosis not present

## 2022-08-10 DIAGNOSIS — Z136 Encounter for screening for cardiovascular disorders: Secondary | ICD-10-CM | POA: Diagnosis not present

## 2022-08-10 DIAGNOSIS — R002 Palpitations: Secondary | ICD-10-CM | POA: Diagnosis not present

## 2022-08-19 DIAGNOSIS — Z125 Encounter for screening for malignant neoplasm of prostate: Secondary | ICD-10-CM | POA: Diagnosis not present

## 2022-08-19 DIAGNOSIS — E291 Testicular hypofunction: Secondary | ICD-10-CM | POA: Diagnosis not present

## 2022-08-21 DIAGNOSIS — I10 Essential (primary) hypertension: Secondary | ICD-10-CM | POA: Diagnosis not present

## 2022-08-21 DIAGNOSIS — R000 Tachycardia, unspecified: Secondary | ICD-10-CM | POA: Diagnosis not present

## 2022-08-21 DIAGNOSIS — R002 Palpitations: Secondary | ICD-10-CM | POA: Diagnosis not present

## 2022-08-21 DIAGNOSIS — E1165 Type 2 diabetes mellitus with hyperglycemia: Secondary | ICD-10-CM | POA: Diagnosis not present

## 2022-08-22 DIAGNOSIS — I4510 Unspecified right bundle-branch block: Secondary | ICD-10-CM | POA: Diagnosis not present

## 2022-09-23 DIAGNOSIS — R002 Palpitations: Secondary | ICD-10-CM | POA: Diagnosis not present

## 2022-09-23 DIAGNOSIS — E1169 Type 2 diabetes mellitus with other specified complication: Secondary | ICD-10-CM | POA: Diagnosis not present

## 2022-09-23 DIAGNOSIS — E7841 Elevated Lipoprotein(a): Secondary | ICD-10-CM | POA: Diagnosis not present

## 2022-09-26 DIAGNOSIS — R002 Palpitations: Secondary | ICD-10-CM | POA: Diagnosis not present

## 2022-10-02 DIAGNOSIS — Z9641 Presence of insulin pump (external) (internal): Secondary | ICD-10-CM | POA: Diagnosis not present

## 2022-10-02 DIAGNOSIS — I10 Essential (primary) hypertension: Secondary | ICD-10-CM | POA: Diagnosis not present

## 2022-10-02 DIAGNOSIS — E1165 Type 2 diabetes mellitus with hyperglycemia: Secondary | ICD-10-CM | POA: Diagnosis not present

## 2022-10-02 DIAGNOSIS — Z7985 Long-term (current) use of injectable non-insulin antidiabetic drugs: Secondary | ICD-10-CM | POA: Diagnosis not present

## 2022-10-02 DIAGNOSIS — Z794 Long term (current) use of insulin: Secondary | ICD-10-CM | POA: Diagnosis not present

## 2022-10-02 DIAGNOSIS — Z978 Presence of other specified devices: Secondary | ICD-10-CM | POA: Diagnosis not present

## 2022-10-02 DIAGNOSIS — E785 Hyperlipidemia, unspecified: Secondary | ICD-10-CM | POA: Diagnosis not present

## 2022-11-22 DIAGNOSIS — Z23 Encounter for immunization: Secondary | ICD-10-CM | POA: Diagnosis not present

## 2022-11-22 DIAGNOSIS — E11319 Type 2 diabetes mellitus with unspecified diabetic retinopathy without macular edema: Secondary | ICD-10-CM | POA: Diagnosis not present

## 2022-11-22 DIAGNOSIS — E291 Testicular hypofunction: Secondary | ICD-10-CM | POA: Diagnosis not present

## 2022-11-22 DIAGNOSIS — E7800 Pure hypercholesterolemia, unspecified: Secondary | ICD-10-CM | POA: Diagnosis not present

## 2022-11-22 DIAGNOSIS — I10 Essential (primary) hypertension: Secondary | ICD-10-CM | POA: Diagnosis not present

## 2022-11-28 DIAGNOSIS — E291 Testicular hypofunction: Secondary | ICD-10-CM | POA: Diagnosis not present

## 2022-11-28 DIAGNOSIS — R3911 Hesitancy of micturition: Secondary | ICD-10-CM | POA: Diagnosis not present

## 2022-12-13 DIAGNOSIS — J343 Hypertrophy of nasal turbinates: Secondary | ICD-10-CM | POA: Diagnosis not present

## 2022-12-13 DIAGNOSIS — R438 Other disturbances of smell and taste: Secondary | ICD-10-CM | POA: Diagnosis not present

## 2023-04-23 DIAGNOSIS — Z794 Long term (current) use of insulin: Secondary | ICD-10-CM | POA: Diagnosis not present

## 2023-04-23 DIAGNOSIS — E1165 Type 2 diabetes mellitus with hyperglycemia: Secondary | ICD-10-CM | POA: Diagnosis not present

## 2023-05-14 DIAGNOSIS — G500 Trigeminal neuralgia: Secondary | ICD-10-CM | POA: Diagnosis not present

## 2023-05-21 DIAGNOSIS — Z125 Encounter for screening for malignant neoplasm of prostate: Secondary | ICD-10-CM | POA: Diagnosis not present

## 2023-05-21 DIAGNOSIS — E291 Testicular hypofunction: Secondary | ICD-10-CM | POA: Diagnosis not present

## 2023-05-30 DIAGNOSIS — E119 Type 2 diabetes mellitus without complications: Secondary | ICD-10-CM | POA: Diagnosis not present

## 2023-05-30 DIAGNOSIS — R079 Chest pain, unspecified: Secondary | ICD-10-CM | POA: Diagnosis not present

## 2023-05-30 DIAGNOSIS — E785 Hyperlipidemia, unspecified: Secondary | ICD-10-CM | POA: Diagnosis not present

## 2023-05-30 DIAGNOSIS — Z7984 Long term (current) use of oral hypoglycemic drugs: Secondary | ICD-10-CM | POA: Diagnosis not present

## 2023-05-30 DIAGNOSIS — Z794 Long term (current) use of insulin: Secondary | ICD-10-CM | POA: Diagnosis not present

## 2023-05-30 DIAGNOSIS — I10 Essential (primary) hypertension: Secondary | ICD-10-CM | POA: Diagnosis not present

## 2023-05-30 DIAGNOSIS — R0789 Other chest pain: Secondary | ICD-10-CM | POA: Diagnosis not present

## 2023-05-30 DIAGNOSIS — R110 Nausea: Secondary | ICD-10-CM | POA: Diagnosis not present

## 2023-05-30 DIAGNOSIS — R519 Headache, unspecified: Secondary | ICD-10-CM | POA: Diagnosis not present

## 2023-06-03 DIAGNOSIS — R079 Chest pain, unspecified: Secondary | ICD-10-CM | POA: Diagnosis not present

## 2023-06-04 DIAGNOSIS — E1165 Type 2 diabetes mellitus with hyperglycemia: Secondary | ICD-10-CM | POA: Diagnosis not present

## 2023-06-04 DIAGNOSIS — Z9641 Presence of insulin pump (external) (internal): Secondary | ICD-10-CM | POA: Diagnosis not present

## 2023-06-04 DIAGNOSIS — Z4681 Encounter for fitting and adjustment of insulin pump: Secondary | ICD-10-CM | POA: Diagnosis not present

## 2023-06-04 DIAGNOSIS — Z794 Long term (current) use of insulin: Secondary | ICD-10-CM | POA: Diagnosis not present

## 2023-06-09 DIAGNOSIS — R1010 Upper abdominal pain, unspecified: Secondary | ICD-10-CM | POA: Diagnosis not present

## 2023-06-09 DIAGNOSIS — R079 Chest pain, unspecified: Secondary | ICD-10-CM | POA: Diagnosis not present

## 2023-06-09 DIAGNOSIS — E871 Hypo-osmolality and hyponatremia: Secondary | ICD-10-CM | POA: Diagnosis not present

## 2023-06-09 DIAGNOSIS — Z1211 Encounter for screening for malignant neoplasm of colon: Secondary | ICD-10-CM | POA: Diagnosis not present

## 2023-06-09 DIAGNOSIS — R195 Other fecal abnormalities: Secondary | ICD-10-CM | POA: Diagnosis not present

## 2023-06-12 DIAGNOSIS — E291 Testicular hypofunction: Secondary | ICD-10-CM | POA: Diagnosis not present

## 2023-06-12 DIAGNOSIS — Z125 Encounter for screening for malignant neoplasm of prostate: Secondary | ICD-10-CM | POA: Diagnosis not present

## 2023-06-12 DIAGNOSIS — R3911 Hesitancy of micturition: Secondary | ICD-10-CM | POA: Diagnosis not present

## 2023-06-20 DIAGNOSIS — U071 COVID-19: Secondary | ICD-10-CM | POA: Diagnosis not present

## 2023-06-28 DIAGNOSIS — G500 Trigeminal neuralgia: Secondary | ICD-10-CM | POA: Diagnosis not present

## 2023-07-04 DIAGNOSIS — G500 Trigeminal neuralgia: Secondary | ICD-10-CM | POA: Diagnosis not present

## 2023-08-01 DIAGNOSIS — E291 Testicular hypofunction: Secondary | ICD-10-CM | POA: Diagnosis not present

## 2023-08-01 DIAGNOSIS — E11319 Type 2 diabetes mellitus with unspecified diabetic retinopathy without macular edema: Secondary | ICD-10-CM | POA: Diagnosis not present

## 2023-08-01 DIAGNOSIS — Z23 Encounter for immunization: Secondary | ICD-10-CM | POA: Diagnosis not present

## 2023-08-01 DIAGNOSIS — E7800 Pure hypercholesterolemia, unspecified: Secondary | ICD-10-CM | POA: Diagnosis not present

## 2023-08-01 DIAGNOSIS — I10 Essential (primary) hypertension: Secondary | ICD-10-CM | POA: Diagnosis not present

## 2023-08-01 DIAGNOSIS — K219 Gastro-esophageal reflux disease without esophagitis: Secondary | ICD-10-CM | POA: Diagnosis not present

## 2023-08-11 DIAGNOSIS — E7800 Pure hypercholesterolemia, unspecified: Secondary | ICD-10-CM | POA: Diagnosis not present

## 2023-08-11 DIAGNOSIS — E870 Hyperosmolality and hypernatremia: Secondary | ICD-10-CM | POA: Diagnosis not present

## 2023-10-28 DIAGNOSIS — E119 Type 2 diabetes mellitus without complications: Secondary | ICD-10-CM | POA: Diagnosis not present

## 2023-11-14 DIAGNOSIS — R600 Localized edema: Secondary | ICD-10-CM | POA: Diagnosis not present

## 2023-11-14 DIAGNOSIS — E119 Type 2 diabetes mellitus without complications: Secondary | ICD-10-CM | POA: Diagnosis not present

## 2023-11-14 DIAGNOSIS — I10 Essential (primary) hypertension: Secondary | ICD-10-CM | POA: Diagnosis not present

## 2023-12-19 DIAGNOSIS — J343 Hypertrophy of nasal turbinates: Secondary | ICD-10-CM | POA: Diagnosis not present

## 2023-12-19 DIAGNOSIS — J329 Chronic sinusitis, unspecified: Secondary | ICD-10-CM | POA: Diagnosis not present

## 2023-12-19 DIAGNOSIS — R438 Other disturbances of smell and taste: Secondary | ICD-10-CM | POA: Diagnosis not present

## 2023-12-19 DIAGNOSIS — J342 Deviated nasal septum: Secondary | ICD-10-CM | POA: Diagnosis not present

## 2024-01-02 DIAGNOSIS — J343 Hypertrophy of nasal turbinates: Secondary | ICD-10-CM | POA: Diagnosis not present

## 2024-01-02 DIAGNOSIS — R438 Other disturbances of smell and taste: Secondary | ICD-10-CM | POA: Diagnosis not present

## 2024-01-02 DIAGNOSIS — J329 Chronic sinusitis, unspecified: Secondary | ICD-10-CM | POA: Diagnosis not present

## 2024-01-02 DIAGNOSIS — J342 Deviated nasal septum: Secondary | ICD-10-CM | POA: Diagnosis not present

## 2024-01-22 DIAGNOSIS — E291 Testicular hypofunction: Secondary | ICD-10-CM | POA: Diagnosis not present

## 2024-01-22 DIAGNOSIS — Z125 Encounter for screening for malignant neoplasm of prostate: Secondary | ICD-10-CM | POA: Diagnosis not present

## 2024-01-29 DIAGNOSIS — E291 Testicular hypofunction: Secondary | ICD-10-CM | POA: Diagnosis not present

## 2024-01-29 DIAGNOSIS — R3911 Hesitancy of micturition: Secondary | ICD-10-CM | POA: Diagnosis not present

## 2024-02-05 DIAGNOSIS — R0609 Other forms of dyspnea: Secondary | ICD-10-CM | POA: Diagnosis not present

## 2024-02-05 DIAGNOSIS — R002 Palpitations: Secondary | ICD-10-CM | POA: Diagnosis not present

## 2024-02-05 DIAGNOSIS — E782 Mixed hyperlipidemia: Secondary | ICD-10-CM | POA: Diagnosis not present

## 2024-02-05 DIAGNOSIS — E7841 Elevated Lipoprotein(a): Secondary | ICD-10-CM | POA: Diagnosis not present

## 2024-02-13 DIAGNOSIS — E7841 Elevated Lipoprotein(a): Secondary | ICD-10-CM | POA: Diagnosis not present

## 2024-02-13 DIAGNOSIS — Z79899 Other long term (current) drug therapy: Secondary | ICD-10-CM | POA: Diagnosis not present

## 2024-02-13 DIAGNOSIS — E782 Mixed hyperlipidemia: Secondary | ICD-10-CM | POA: Diagnosis not present

## 2024-02-13 DIAGNOSIS — R000 Tachycardia, unspecified: Secondary | ICD-10-CM | POA: Diagnosis not present

## 2024-02-13 DIAGNOSIS — R002 Palpitations: Secondary | ICD-10-CM | POA: Diagnosis not present

## 2024-02-13 DIAGNOSIS — R0609 Other forms of dyspnea: Secondary | ICD-10-CM | POA: Diagnosis not present

## 2024-02-13 DIAGNOSIS — R079 Chest pain, unspecified: Secondary | ICD-10-CM | POA: Diagnosis not present

## 2024-02-23 DIAGNOSIS — E1165 Type 2 diabetes mellitus with hyperglycemia: Secondary | ICD-10-CM | POA: Diagnosis not present

## 2024-02-23 DIAGNOSIS — Z794 Long term (current) use of insulin: Secondary | ICD-10-CM | POA: Diagnosis not present

## 2024-02-24 DIAGNOSIS — E1169 Type 2 diabetes mellitus with other specified complication: Secondary | ICD-10-CM | POA: Diagnosis not present

## 2024-02-24 DIAGNOSIS — I10 Essential (primary) hypertension: Secondary | ICD-10-CM | POA: Diagnosis not present

## 2024-02-24 DIAGNOSIS — R002 Palpitations: Secondary | ICD-10-CM | POA: Diagnosis not present

## 2024-02-24 DIAGNOSIS — E782 Mixed hyperlipidemia: Secondary | ICD-10-CM | POA: Diagnosis not present

## 2024-02-24 DIAGNOSIS — Z13220 Encounter for screening for lipoid disorders: Secondary | ICD-10-CM | POA: Diagnosis not present

## 2024-02-24 DIAGNOSIS — E7841 Elevated Lipoprotein(a): Secondary | ICD-10-CM | POA: Diagnosis not present

## 2024-02-24 DIAGNOSIS — E1165 Type 2 diabetes mellitus with hyperglycemia: Secondary | ICD-10-CM | POA: Diagnosis not present

## 2024-02-24 DIAGNOSIS — Z794 Long term (current) use of insulin: Secondary | ICD-10-CM | POA: Diagnosis not present

## 2024-03-23 ENCOUNTER — Other Ambulatory Visit: Payer: Self-pay | Admitting: Family Medicine

## 2024-03-23 DIAGNOSIS — G8929 Other chronic pain: Secondary | ICD-10-CM

## 2024-03-24 NOTE — Discharge Instructions (Signed)

## 2024-03-25 ENCOUNTER — Ambulatory Visit
Admission: RE | Admit: 2024-03-25 | Discharge: 2024-03-25 | Disposition: A | Discharge status: Home / Self Care | Source: Ambulatory Visit | Attending: Family Medicine | Admitting: Family Medicine

## 2024-03-25 DIAGNOSIS — M47817 Spondylosis without myelopathy or radiculopathy, lumbosacral region: Secondary | ICD-10-CM | POA: Diagnosis not present

## 2024-03-25 DIAGNOSIS — G8929 Other chronic pain: Secondary | ICD-10-CM

## 2024-03-25 MED ORDER — METHYLPREDNISOLONE ACETATE 40 MG/ML INJ SUSP (RADIOLOG
80.0000 mg | Freq: Once | INTRAMUSCULAR | Status: AC
  Administered 2024-03-25: 80 mg via EPIDURAL

## 2024-03-25 MED ORDER — IOPAMIDOL (ISOVUE-M 200) INJECTION 41%
1.0000 mL | Freq: Once | INTRAMUSCULAR | Status: AC
  Administered 2024-03-25: 1 mL via EPIDURAL

## 2024-07-06 DIAGNOSIS — L814 Other melanin hyperpigmentation: Secondary | ICD-10-CM | POA: Diagnosis not present

## 2024-07-06 DIAGNOSIS — D225 Melanocytic nevi of trunk: Secondary | ICD-10-CM | POA: Diagnosis not present

## 2024-07-06 DIAGNOSIS — D2261 Melanocytic nevi of right upper limb, including shoulder: Secondary | ICD-10-CM | POA: Diagnosis not present

## 2024-07-06 DIAGNOSIS — D0461 Carcinoma in situ of skin of right upper limb, including shoulder: Secondary | ICD-10-CM | POA: Diagnosis not present

## 2024-07-06 DIAGNOSIS — L570 Actinic keratosis: Secondary | ICD-10-CM | POA: Diagnosis not present

## 2024-07-06 DIAGNOSIS — D2262 Melanocytic nevi of left upper limb, including shoulder: Secondary | ICD-10-CM | POA: Diagnosis not present

## 2024-07-06 DIAGNOSIS — C44519 Basal cell carcinoma of skin of other part of trunk: Secondary | ICD-10-CM | POA: Diagnosis not present

## 2024-08-05 DIAGNOSIS — E291 Testicular hypofunction: Secondary | ICD-10-CM | POA: Diagnosis not present

## 2024-08-05 DIAGNOSIS — I10 Essential (primary) hypertension: Secondary | ICD-10-CM | POA: Diagnosis not present

## 2024-08-05 DIAGNOSIS — E7800 Pure hypercholesterolemia, unspecified: Secondary | ICD-10-CM | POA: Diagnosis not present

## 2024-08-05 DIAGNOSIS — Z23 Encounter for immunization: Secondary | ICD-10-CM | POA: Diagnosis not present

## 2024-08-05 DIAGNOSIS — E11319 Type 2 diabetes mellitus with unspecified diabetic retinopathy without macular edema: Secondary | ICD-10-CM | POA: Diagnosis not present

## 2024-08-20 DIAGNOSIS — M5417 Radiculopathy, lumbosacral region: Secondary | ICD-10-CM | POA: Diagnosis not present

## 2024-08-20 DIAGNOSIS — M51369 Other intervertebral disc degeneration, lumbar region without mention of lumbar back pain or lower extremity pain: Secondary | ICD-10-CM | POA: Diagnosis not present

## 2024-08-24 DIAGNOSIS — M545 Low back pain, unspecified: Secondary | ICD-10-CM | POA: Diagnosis not present

## 2024-08-26 DIAGNOSIS — M4726 Other spondylosis with radiculopathy, lumbar region: Secondary | ICD-10-CM | POA: Diagnosis not present

## 2024-09-07 DIAGNOSIS — M5416 Radiculopathy, lumbar region: Secondary | ICD-10-CM | POA: Diagnosis not present

## 2024-09-08 DIAGNOSIS — M5416 Radiculopathy, lumbar region: Secondary | ICD-10-CM | POA: Diagnosis not present

## 2024-11-01 DIAGNOSIS — E7841 Elevated Lipoprotein(a): Secondary | ICD-10-CM | POA: Diagnosis not present

## 2024-11-01 DIAGNOSIS — I2584 Coronary atherosclerosis due to calcified coronary lesion: Secondary | ICD-10-CM | POA: Diagnosis not present

## 2024-11-01 DIAGNOSIS — E782 Mixed hyperlipidemia: Secondary | ICD-10-CM | POA: Diagnosis not present

## 2024-11-01 DIAGNOSIS — R002 Palpitations: Secondary | ICD-10-CM | POA: Diagnosis not present

## 2024-11-01 DIAGNOSIS — R0609 Other forms of dyspnea: Secondary | ICD-10-CM | POA: Diagnosis not present

## 2024-11-01 DIAGNOSIS — I2510 Atherosclerotic heart disease of native coronary artery without angina pectoris: Secondary | ICD-10-CM | POA: Diagnosis not present
